# Patient Record
Sex: Female | Born: 1990 | Race: Asian | Hispanic: No | Marital: Single | Smoking: Never smoker
Health system: Southern US, Community
[De-identification: ages and names within clinical notes are randomized; demographics above are authoritative.]

## PROBLEM LIST (undated history)

## (undated) DIAGNOSIS — R569 Unspecified convulsions: Secondary | ICD-10-CM

---

## 2015-08-02 ENCOUNTER — Emergency Department (HOSPITAL_BASED_OUTPATIENT_CLINIC_OR_DEPARTMENT_OTHER): Payer: Self-pay

## 2015-08-02 ENCOUNTER — Emergency Department (HOSPITAL_BASED_OUTPATIENT_CLINIC_OR_DEPARTMENT_OTHER)
Admission: EM | Admit: 2015-08-02 | Discharge: 2015-08-03 | Disposition: A | Discharge status: Home / Self Care | Payer: Self-pay | Attending: Emergency Medicine | Admitting: Emergency Medicine

## 2015-08-02 ENCOUNTER — Encounter (HOSPITAL_BASED_OUTPATIENT_CLINIC_OR_DEPARTMENT_OTHER): Payer: Self-pay

## 2015-08-02 DIAGNOSIS — S43015A Anterior dislocation of left humerus, initial encounter: Secondary | ICD-10-CM | POA: Insufficient documentation

## 2015-08-02 DIAGNOSIS — W19XXXA Unspecified fall, initial encounter: Secondary | ICD-10-CM | POA: Insufficient documentation

## 2015-08-02 DIAGNOSIS — Z9114 Patient's other noncompliance with medication regimen: Secondary | ICD-10-CM | POA: Insufficient documentation

## 2015-08-02 DIAGNOSIS — Z79899 Other long term (current) drug therapy: Secondary | ICD-10-CM | POA: Insufficient documentation

## 2015-08-02 DIAGNOSIS — Y939 Activity, unspecified: Secondary | ICD-10-CM | POA: Insufficient documentation

## 2015-08-02 DIAGNOSIS — G40909 Epilepsy, unspecified, not intractable, without status epilepticus: Secondary | ICD-10-CM | POA: Insufficient documentation

## 2015-08-02 DIAGNOSIS — Y999 Unspecified external cause status: Secondary | ICD-10-CM | POA: Insufficient documentation

## 2015-08-02 DIAGNOSIS — S43005A Unspecified dislocation of left shoulder joint, initial encounter: Secondary | ICD-10-CM

## 2015-08-02 DIAGNOSIS — Y929 Unspecified place or not applicable: Secondary | ICD-10-CM | POA: Insufficient documentation

## 2015-08-02 DIAGNOSIS — R569 Unspecified convulsions: Secondary | ICD-10-CM

## 2015-08-02 HISTORY — DX: Unspecified convulsions: R56.9

## 2015-08-02 MED ORDER — ONDANSETRON HCL 4 MG/2ML IJ SOLN
INTRAMUSCULAR | Status: DC
Start: 2015-08-02 — End: 2015-08-03
  Filled 2015-08-02: qty 2

## 2015-08-02 MED ORDER — PROPOFOL 10 MG/ML IV BOLUS
100.0000 mg | Freq: Once | INTRAVENOUS | Status: AC
  Administered 2015-08-02: 100 mg via INTRAVENOUS
  Filled 2015-08-02: qty 20

## 2015-08-02 MED ORDER — LAMOTRIGINE 100 MG PO TABS
100.0000 mg | ORAL_TABLET | Freq: Once | ORAL | Status: DC
  Filled 2015-08-02: qty 1

## 2015-08-02 MED ORDER — FENTANYL CITRATE (PF) 100 MCG/2ML IJ SOLN
100.0000 ug | Freq: Once | INTRAMUSCULAR | Status: AC
  Administered 2015-08-02: 100 ug via INTRAVENOUS
  Filled 2015-08-02: qty 2

## 2015-08-02 MED ORDER — FENTANYL CITRATE (PF) 100 MCG/2ML IJ SOLN
INTRAMUSCULAR | Status: AC | PRN
  Administered 2015-08-02: 50 ug via INTRAVENOUS

## 2015-08-02 MED ORDER — LEVETIRACETAM 500 MG PO TABS
500.0000 mg | ORAL_TABLET | Freq: Once | ORAL | Status: AC
  Administered 2015-08-03: 500 mg via ORAL
  Filled 2015-08-02: qty 1

## 2015-08-02 MED ORDER — PROPOFOL 10 MG/ML IV BOLUS
INTRAVENOUS | Status: AC | PRN
Start: 2015-08-02 — End: 2015-08-02
  Administered 2015-08-02: 100 mg via INTRAVENOUS

## 2015-08-02 NOTE — ED Notes (Addendum)
Pt states she had a seizure and injured left shoulder-NAD-steady gait-has sling from scarf in place upon arrival-reports hx of dislocation to shoulder after seizure/injury in the past

## 2015-08-02 NOTE — ED Provider Notes (Signed)
CSN: [id number]     Arrival date & time 08/02/15  2125 History   First MD Initiated Contact with Patient 08/02/15 2233     Chief Complaint  Patient presents with  . Shoulder Injury    Emily Johnson is a 25 y.o. female who presents to the emergency department after a seizure and sustaining shoulder injury. The patient reports she has a history of seizures and forgot to take her seizure medicine today. She takes Lamictal 100 mg twice a day and Keppra 500 mg twice a day. She reports forgetting her morning doses today. She's had none of her seizure medications today. Patient reports she awoke from a nap and had an oral controlled she is about to have a seizure. She went to get her seizure medications and then fell on her left shoulder. She is complaining of pain to her left shoulder currently. Family reports the patient had a staring seizure which is what her typical seizures. She is visiting from [place] today. She has a neurologist in [place]. Patient denies fevers, recent illness, headache, change in vision, neck pain, back pain, abdominal pain, nausea, vomiting, diarrhea or chest pain or shortness of breath, numbness, tingling.   Patient is a 25 y.o. female presenting with shoulder injury. The history is provided by the patient and a relative. No language interpreter was used.  Shoulder Injury Associated symptoms include arthralgias. Pertinent negatives include no abdominal pain, chest pain, chills, congestion, coughing, fever, headaches, nausea, neck pain, numbness, rash, sore throat or vomiting.    Past Medical History  Diagnosis Date  . Seizures (HCC)    History reviewed. No pertinent past surgical history. No family history on file. Social History  Substance Use Topics  . Smoking status: Never Smoker   . Smokeless tobacco: None  . Alcohol Use: No   OB History    No data available     Review of Systems  Constitutional: Negative for fever and chills.  HENT: Negative  for congestion and sore throat.   Eyes: Negative for visual disturbance.  Respiratory: Negative for cough, shortness of breath and wheezing.   Cardiovascular: Negative for chest pain.  Gastrointestinal: Negative for nausea, vomiting, abdominal pain and diarrhea.  Genitourinary: Negative for dysuria.  Musculoskeletal: Positive for arthralgias. Negative for back pain and neck pain.  Skin: Negative for rash and wound.  Neurological: Positive for seizures. Negative for dizziness, numbness and headaches.      Allergies  Review of patient's allergies indicates no known allergies.  Home Medications   Prior to Admission medications   Medication Sig Start Date End Date Taking? Authorizing Provider  lamoTRIgine (LAMICTAL) 100 MG tablet Take 100 mg by mouth 2 (two) times daily.   Yes Historical Provider, MD  levETIRAcetam (KEPPRA) 500 MG tablet Take 500 mg by mouth 2 (two) times daily.   Yes Historical Provider, MD  LORazepam (ATIVAN) 1 MG tablet Take 1 mg by mouth every 8 (eight) hours as needed for anxiety.   Yes Historical Provider, MD  naproxen (NAPROSYN) 250 MG tablet Take 1 tablet (250 mg total) by mouth 2 (two) times daily with a meal. 08/03/15   Everlene FarrierWilliam Siddharth Babington, PA-C   BP 114/91 mmHg  Pulse 81  Temp(Src) 98 F (36.7 C) (Oral)  Resp 18  Ht 5\' 3"  (1.6 m)  Wt 79.379 kg  BMI 31.01 kg/m2  SpO2 100%  LMP 08/02/2015 Physical Exam  Constitutional: She is oriented to person, place, and time. She appears well-developed and well-nourished. No  distress.  Nontoxic appearing.  HENT:  Head: Normocephalic and atraumatic.  Right Ear: External ear normal.  Left Ear: External ear normal.  Mouth/Throat: Oropharynx is clear and moist.  No visible evidence of head trauma.  Eyes: Conjunctivae and EOM are normal. Pupils are equal, round, and reactive to light. Right eye exhibits no discharge. Left eye exhibits no discharge.  Neck: Normal range of motion. Neck supple.  Cardiovascular: Normal rate,  regular rhythm, normal heart sounds and intact distal pulses.   Bilateral radial pulses are intact. Good capillary refill to her left distal fingertips.  Pulmonary/Chest: Effort normal and breath sounds normal. No respiratory distress. She has no wheezes. She has no rales.  Lungs clear to auscultation bilaterally.  Abdominal: Soft. There is no tenderness. There is no guarding.  Musculoskeletal: She exhibits tenderness.  Patient has tenderness and obvious deformity to her left shoulder. Her shoulder appears to have an anterior dislocation. No left elbow or wrist tenderness to palpation. No open wounds.  Lymphadenopathy:    She has no cervical adenopathy.  Neurological: She is alert and oriented to person, place, and time. No cranial nerve deficit. Coordination normal.  The patient is alert and oriented 3. Speech is clear and coherent. EOMs are intact. Cranial nerves are intact. Sensation is intact to her bilateral upper and lower extremities.  Skin: Skin is warm and dry. No rash noted. She is not diaphoretic. No erythema. No pallor.  Psychiatric: She has a normal mood and affect. Her behavior is normal.  Nursing note and vitals reviewed.   ED Course  Procedures (including critical care time) Labs Review Labs Reviewed - No data to display  Imaging Review Dg Shoulder Left  08/02/2015  CLINICAL DATA:  Initial evaluation for acute injury. EXAM: LEFT SHOULDER - 2+ VIEW COMPARISON:  None. FINDINGS: Left humeral head is dislocated anteriorly and inferiorly relative to the glenoid. No definite associated fracture. AC joint grossly approximated. No acute soft tissue abnormality. Visualized left-sided ribs intact. IMPRESSION: Anterior inferior dislocation of the left shoulder. Electronically Signed   By: Rise MuBenjamin  McClintock M.D.   On: 08/02/2015 22:06   Dg Shoulder Left Port  08/03/2015  CLINICAL DATA:  Follow-up examination status post reduction. EXAM: LEFT SHOULDER - 1 VIEW COMPARISON:  Prior  radiograph from earlier the same day. FINDINGS: Left humeral head now in normal alignment with the glenoid status post reduction. AC joint approximated. No acute fracture identified. Soft tissues within normal limits. Partially visualized left hemi thorax grossly clear. IMPRESSION: Left shoulder in normal anatomic alignment status post reduction. No associated fracture identified. Electronically Signed   By: Rise MuBenjamin  McClintock M.D.   On: 08/03/2015 00:15   I have personally reviewed and evaluated these images and lab results as part of my medical decision-making.   EKG Interpretation None      Filed Vitals:   08/02/15 2356 08/03/15 0000 08/03/15 0001 08/03/15 0017  BP: 121/89 132/98 132/98 114/91  Pulse: 81 84 94 81  Temp:      TempSrc:      Resp: 18 17 18 18   Height:      Weight:      SpO2: 100% 100% 99% 100%     MDM   Meds given in ED:  Medications  ondansetron (ZOFRAN) 4 MG/2ML injection (not administered)  fentaNYL (SUBLIMAZE) injection 100 mcg (100 mcg Intravenous Given 08/02/15 2327)  levETIRAcetam (KEPPRA) tablet 500 mg (500 mg Oral Given 08/03/15 0016)  propofol (DIPRIVAN) 10 mg/mL bolus/IV push 100 mg (0  mg Intravenous Stopped 08/03/15 0002)  propofol (DIPRIVAN) 10 mg/mL bolus/IV push ( Intravenous Stopped 08/03/15 0008)  fentaNYL (SUBLIMAZE) injection (50 mcg Intravenous Given 08/02/15 2342)    Discharge Medication List as of 08/03/2015 12:39 AM    START taking these medications   Details  naproxen (NAPROSYN) 250 MG tablet Take 1 tablet (250 mg total) by mouth 2 (two) times daily with a meal., Starting 08/03/2015, Until Discontinued, Print        Final diagnoses:  Shoulder dislocation, left, initial encounter  Seizure (HCC)  Non compliance w medication regimen    This is a 25 y.o. female who presents to the emergency department after a seizure and sustaining shoulder injury. The patient reports she has a history of seizures and forgot to take her seizure medicine  today. She takes Lamictal 100 mg twice a day and Keppra 500 mg twice a day. She reports forgetting her morning doses today. She's had none of her seizure medications today. Patient reports she awoke from a nap and had an oral controlled she is about to have a seizure. She went to get her seizure medications and then fell on her left shoulder. She is complaining of pain to her left shoulder currently. Family reports the patient had a staring seizure which is what her typical seizures. She is visiting from [place] today. She has a neurologist in [place].  On exam the patient is afebrile nontoxic appearing. She is alert and oriented 3. Cranial nerves are intact. She has no focal neurologic deficits. She has left shoulder deformity with what appears to be anterior dislocation. This is confirmed with x-ray. She is neurovascularly intact. Conscious sedation was performed by Dr. Donnald Garre and left shoulder was successfully reduced. Patient was provided with Keppra dose in the emergency department. Unfortunately this facility did not have Lamictal. Patient reports she has plenty of her medications at home. I encouraged her to take Lamictal when she gets home. She has had no further seizures in the ED. as the patient missed her seizure medication dosing I see no need for further workup. This is the likely cause of her seizure. I encouraged her to not miss any doses of her seizures and to follow-up with her orthopedic surgeon's and neurologist. I advised the patient to follow-up with their primary care provider this week. I advised the patient to return to the emergency department with new or worsening symptoms or new concerns. The patient verbalized understanding and agreement with plan.    This patient was discussed with and evaluated by Dr. Donnald Garre who agrees with assessment and plan.   Everlene Farrier, PA-C 08/03/15 1478  Arby Barrette, MD 08/05/15 1135

## 2015-08-02 NOTE — ED Notes (Signed)
Pt states had a seizure this pm,  Fell,  C/o left shoulder pain

## 2015-08-02 NOTE — Progress Notes (Signed)
Placed patient on ETCO2 monitor.

## 2015-08-03 MED ORDER — NAPROXEN 250 MG PO TABS: 250.0000 mg | ORAL_TABLET | Freq: Two times a day (BID) | ORAL | Status: AC

## 2015-08-03 NOTE — Discharge Instructions (Signed)
How to Use a Shoulder Immobilizer A shoulder immobilizer is a device that you may have to wear after a shoulder injury or surgery. This device keeps your arm from moving. This prevents additional pain or injury. It also supports your arm next to your body as your shoulder heals. You may need to wear a shoulder immobilizer to treat a broken bone (fracture) in your shoulder. You may also need to wear one if you have an injury that moves your shoulder out of position (dislocation). There are different types of shoulder immobilizers. The one that you get depends on your injury. RISKS AND COMPLICATIONS Wearing a shoulder immobilizer in the wrong way can let your injured shoulder move around too much. This may delay healing and make your pain and swelling worse. HOW TO USE YOUR SHOULDER IMMOBILIZER  The part of the immobilizer that goes around your neck (sling) should support your upper arm, with your elbow bent and your lower arm and hand across your chest.  Make sure that your elbow:  Is snug against the back pocket of the sling.  Does not move away from your body.  The strap of the immobilizer should go over your shoulder and support your arm and hand. Your hand should be slightly higher than your elbow. It should not hang loosely over the edge of the sling.  If the long strap has a pad, place it where it is most comfortable on your neck.  Carefully follow your health care provider's instructions for wearing your shoulder immobilizer. Your health care provider may want you to:  Loosen your immobilizer to straighten your elbow and move your wrist and fingers. You may have to do this several times each day. Ask your health care provider when you should do this and how often.  Remove your immobilizer once every day to shower, but limit the movement in your injured arm. Before putting the immobilizer back on, use a towel to dry the area under your arm completely.  Remove your immobilizer to do  shoulder exercises at home as directed by your health care provider.  Wear your immobilizer while you sleep. You may sleep more comfortably if you have your upper body raised on pillows. SEEK MEDICAL CARE IF:  Your immobilizer is not supporting your arm properly.  Your immobilizer gets damaged.  You have worsening pain or swelling in your shoulder, arm, or hand.  Your shoulder, arm, or hand changes color or temperature.  You lose feeling in your shoulder, arm, or hand.   This information is not intended to replace advice given to you by your health care provider. Make sure you discuss any questions you have with your health care provider.   Document Released: 02/29/2004 Document Revised: 06/07/2014 Document Reviewed: 12/29/2013 Elsevier Interactive Patient Education 2016 Else[employer] Inc[employer] Shoulder Dislocation A shoulder dislocation happens when the upper arm bone (humerus) moves out of the shoulder joint. The shoulder joint is the part of the shoulder where the humerus, shoulder blade (scapula), and collarbone (clavicle) meet. CAUSES This condition is often caused by:  A fall.  A hit to the shoulder.  A forceful movement of the shoulder. RISK FACTORS This condition is more likely to develop in people who play sports. SYMPTOMS Symptoms of this condition include:  Deformity of the shoulder.  Intense pain.  Inability to move the shoulder.  Numbness, weakness, or tingling in your neck or down your arm.  Bruising or swelling around your shoulder. DIAGNOSIS This condition is diagnosed with a  physical exam. After the exam, tests may be done to check for related problems. Tests that may be done include:  X-ray. This may be done to check for broken bones.  MRI. This may be done to check for damage to the tissues around the shoulder.  Electromyogram. This may be done to check for nerve damage. TREATMENT This condition is treated with a procedure to place the humerus back in the  joint. This procedure is called a reduction. There are two types of reduction:  Closed reduction. In this procedure, the humerus is placed back in the joint without surgery. The health care provider uses his or her hands to guide the bone back into place.  Open reduction. In this procedure, the humerus is placed back in the joint with surgery. An open reduction may be recommended if:  You have a weak shoulder joint or weak ligaments.  You have had more than one shoulder dislocation.  The nerves or blood vessels around your shoulder have been damaged. After the humerus is placed back into the joint, your arm will be placed in a splint or sling to prevent it from moving. You will need to wear the splint or sling until your shoulder heals. When the splint or sling is removed, you may have physical therapy to help improve the range of motion in your shoulder joint. HOME CARE INSTRUCTIONS If You Have a Splint or Sling:  Wear it as told by your health care provider. Remove it only as told by your health care provider.  Loosen it if your fingers become numb and tingle, or if they turn cold and blue.  Keep it clean and dry. Bathing  Do not take baths, swim, or use a hot tub until your health care provider approves. Ask your health care provider if you can take showers. You may only be allowed to take sponge baths for bathing.  If your health care provider approves bathing and showering, cover your splint or sling with a watertight plastic bag to protect it from water. Do not let the splint or sling get wet. Managing Pain, Stiffness, and Swelling  If directed, apply ice to the injured area.  Put ice in a plastic bag.  Place a towel between your skin and the bag.  Leave the ice on for 20 minutes, 2-3 times per day.  Move your fingers often to avoid stiffness and to decrease swelling.  Raise (elevate) the injured area above the level of your heart while you are sitting or lying  down. Driving  Do not drive while wearing a splint or sling on a hand that you use for driving.  Do not drive or operate heavy machinery while taking pain medicine. Activity  Return to your normal activities as told by your health care provider. Ask your health care provider what activities are safe for you.  Perform range-of-motion exercises only as told by your health care provider.  Exercise your hand by squeezing a soft ball. This helps to decrease stiffness and swelling in your hand and wrist. General Instructions  Take over-the-counter and prescription medicines only as told by your health care provider.  Do not use any tobacco products, including cigarettes, chewing tobacco, or e-cigarettes. Tobacco can delay bone and tissue healing. If you need help quitting, ask your health care provider.  Keep all follow-up visits as told by your health care provider. This is important. SEEK MEDICAL CARE IF:  Your splint or sling gets damaged. SEEK IMMEDIATE MEDICAL CARE IF:  Your pain gets worse rather than better.  You lose feeling in your arm or hand.  Your arm or hand becomes white and cold.   This information is not intended to replace advice given to you by your health care provider. Make sure you discuss any questions you have with your health care provider.   Document Released: 10/16/2000 Document Revised: 10/12/2014 Document Reviewed: 05/16/2014 Elsevier Interactive Patient Education [employer]2016 [employer]   Seizure, Adult A seizure is abnormal electrical activity in the brain. Seizures usually last from 30 seconds to 2 minutes. There are various types of seizures. Before a seizure, you may have a warning sensation (aura) that a seizure is about to occur. An aura may include the following symptoms:   Fear or anxiety.  Nausea.  Feeling like the room is spinning (vertigo).  Vision changes, such as seeing flashing lights or spots. Common symptoms during a seizure  include:  A change in attention or behavior (altered mental status).  Convulsions with rhythmic jerking movements.  Drooling.  Rapid eye movements.  Grunting.  Loss of bladder and bowel control.  Bitter taste in the mouth.  Tongue biting. After a seizure, you may feel confused and sleepy. You may also have an injury resulting from convulsions during the seizure. HOME CARE INSTRUCTIONS   If you are given medicines, take them exactly as prescribed by your health care provider.  Keep all follow-up appointments as directed by your health care provider.  Do not swim or drive or engage in risky activity during which a seizure could cause further injury to you or others until your health care provider says it is OK.  Get adequate rest.  Teach friends and family what to do if you have a seizure. They should:  Lay you on the ground to prevent a fall.  Put a cushion under your head.  Loosen any tight clothing around your neck.  Turn you on your side. If vomiting occurs, this helps keep your airway clear.  Stay with you until you recover.  Know whether or not you need emergency care. SEEK IMMEDIATE MEDICAL CARE IF:  The seizure lasts longer than 5 minutes.  The seizure is severe or you do not wake up immediately after the seizure.  You have an altered mental status after the seizure.  You are having more frequent or worsening seizures. Someone should drive you to the emergency department or call local emergency services (911 in [place] MAKE SURE YOU:  Understand these instructions.  Will watch your condition.  Will get help right away if you are not doing well or get worse.   This information is not intended to replace advice given to you by your health care provider. Make sure you discuss any questions you have with your health care provider.   Document Released: 01/19/2000 Document Revised: 02/11/2014 Document Reviewed: 09/02/2012 Elsevier Interactive Patient  Education [employer]2016 Elsevier Inc[employer]

## 2017-12-26 IMAGING — CR DG SHOULDER 2+V*L*
2 series · 2 of 2 positions shown · non-contrast
Comparison: None.

CLINICAL DATA: Initial evaluation for acute injury.

EXAM:
LEFT SHOULDER - 2+ VIEW

[w shoulder grashey left]
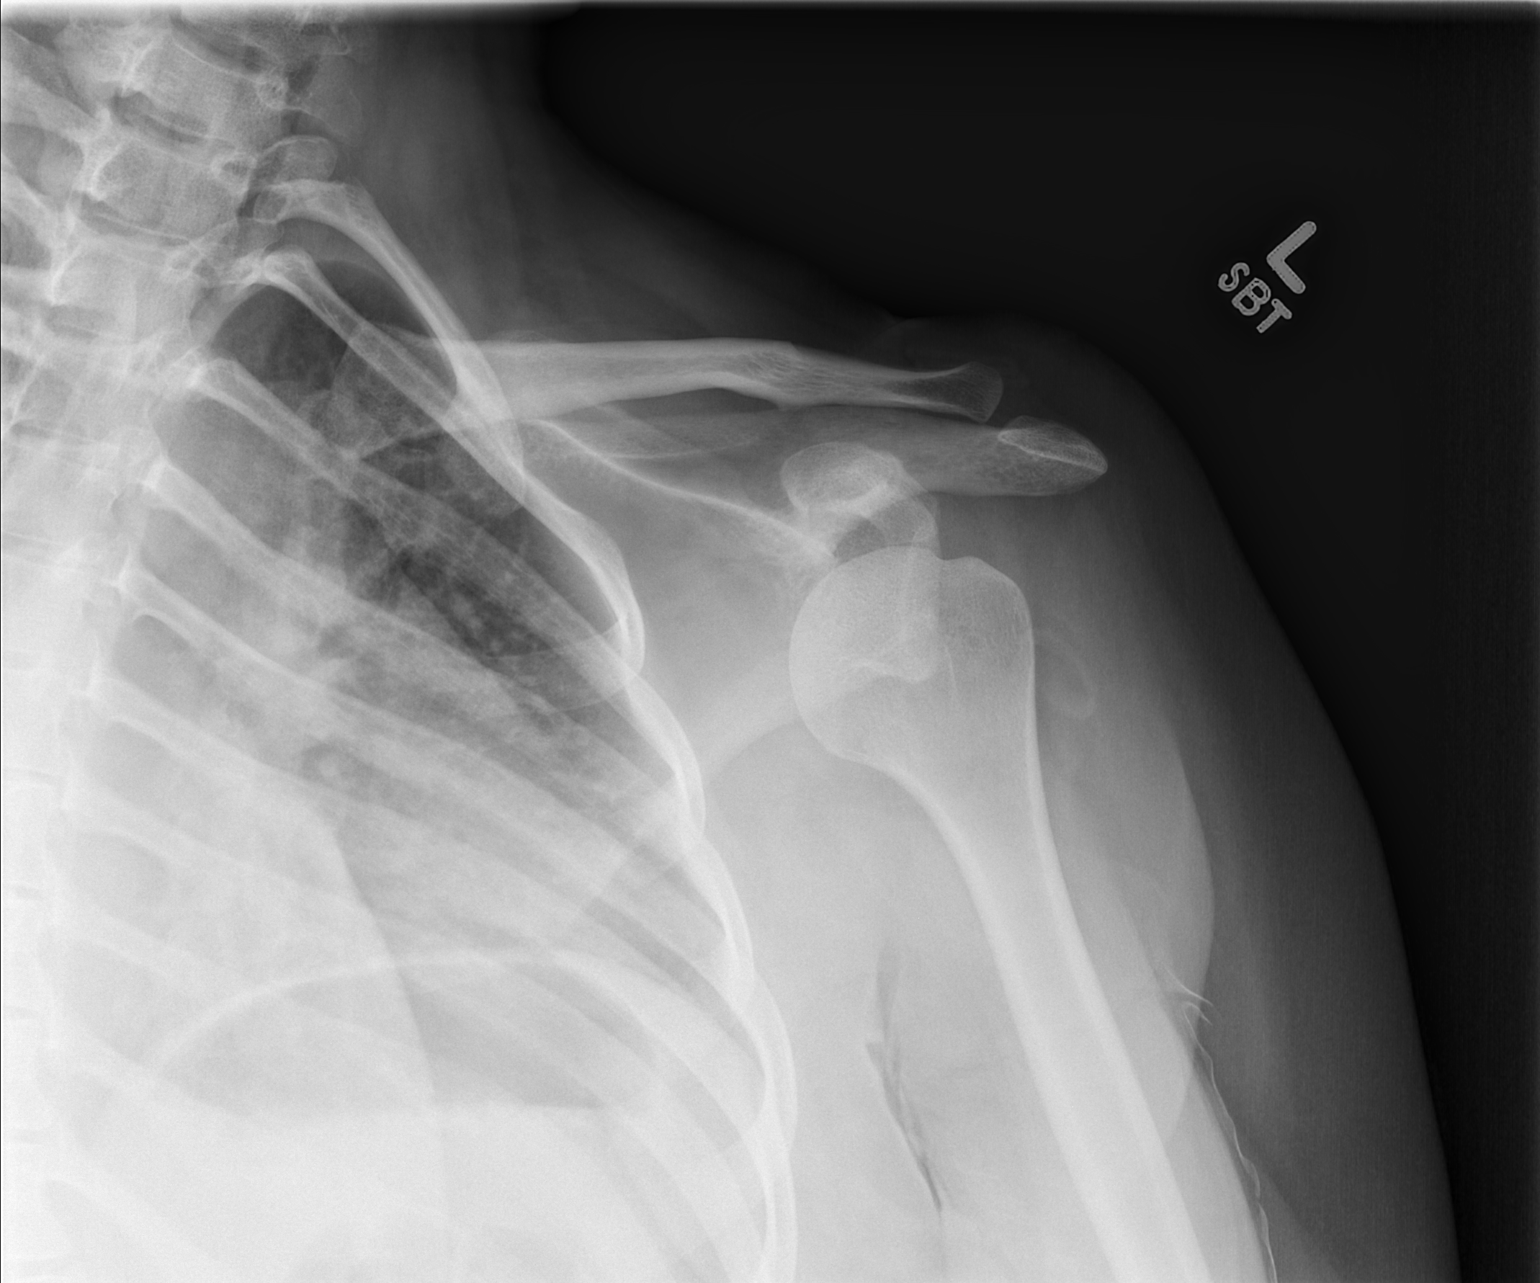

[w shoulder y view left]
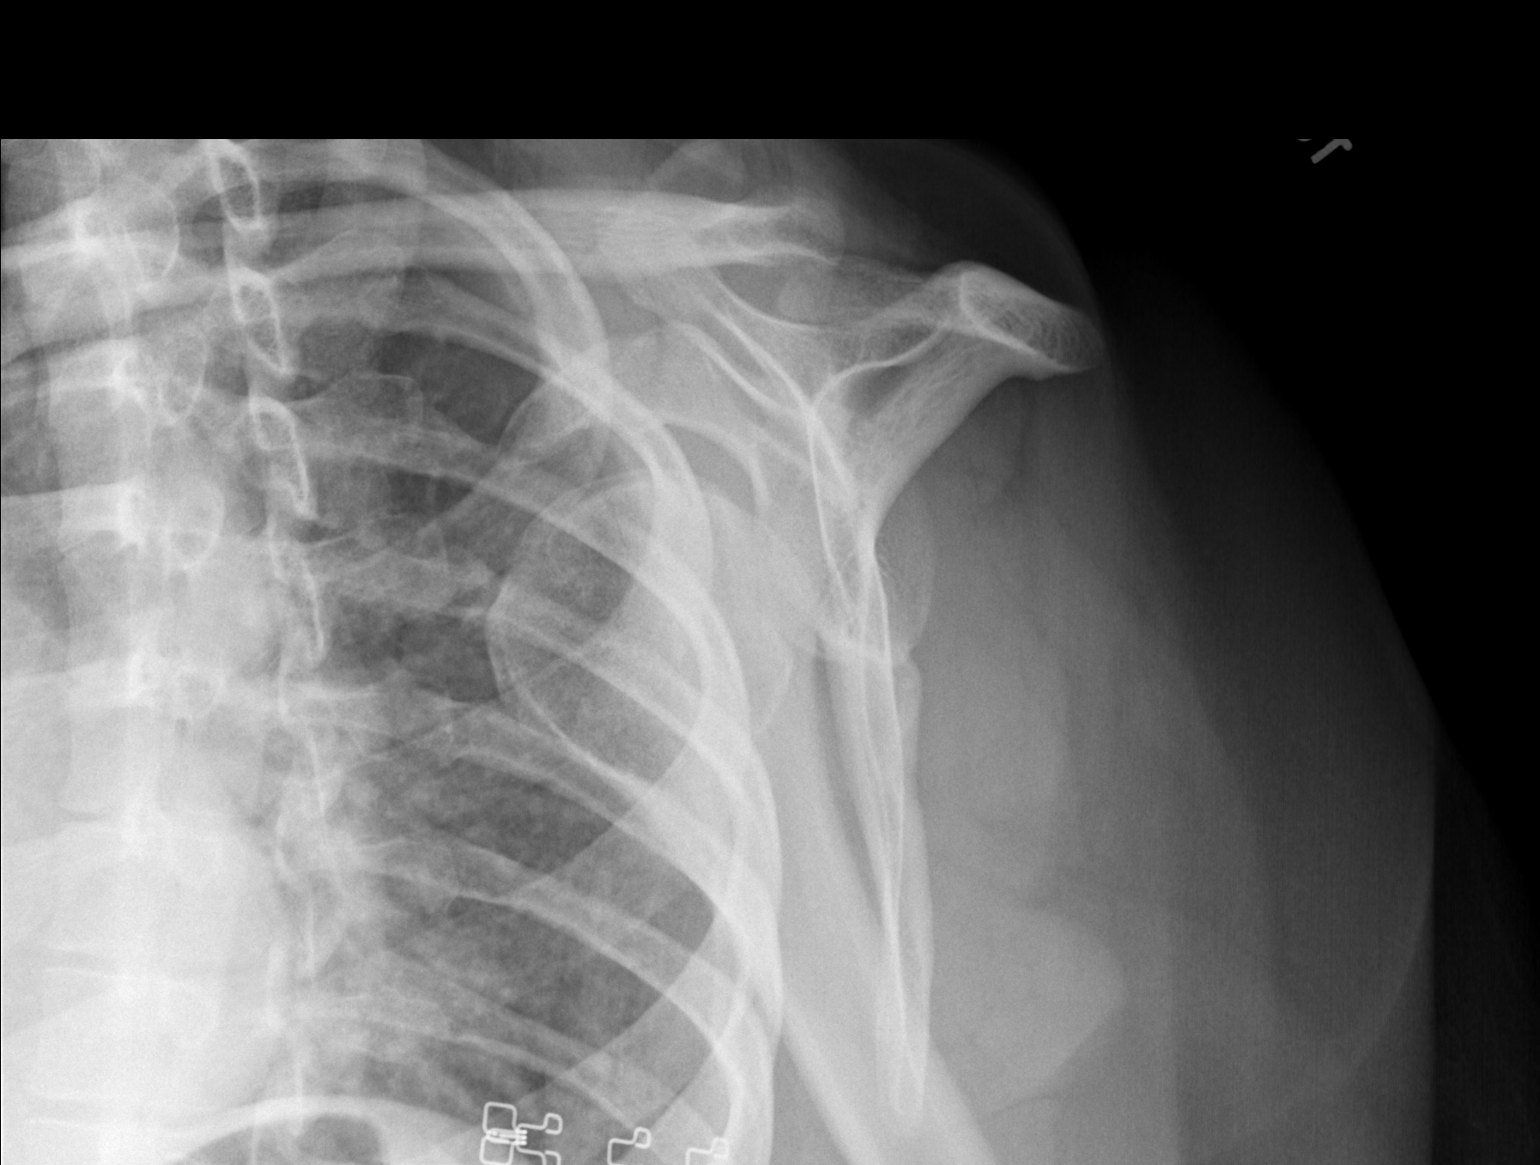

[2 of 2 positions shown; findings below may reference images not displayed]

FINDINGS: Left humeral head is dislocated anteriorly and inferiorly relative
to the glenoid. No definite associated fracture. AC joint grossly
approximated. No acute soft tissue abnormality. Visualized
left-sided ribs intact.
IMPRESSION: Anterior inferior dislocation of the left shoulder.
